# Patient Record
Sex: Female | Born: 1965 | Race: White | Hispanic: No | Marital: Married
Health system: Southern US, Community
[De-identification: ages and names within clinical notes are randomized; demographics above are authoritative.]

---

## 1997-11-11 ENCOUNTER — Inpatient Hospital Stay (HOSPITAL_COMMUNITY): Admission: AD | Admit: 1997-11-11 | Discharge: 1997-11-14 | Payer: Self-pay | Admitting: *Deleted

## 1997-11-18 ENCOUNTER — Encounter: Admission: RE | Admit: 1997-11-18 | Discharge: 1998-02-16 | Payer: Self-pay | Admitting: *Deleted

## 1998-02-23 ENCOUNTER — Encounter (HOSPITAL_COMMUNITY): Admission: RE | Admit: 1998-02-23 | Discharge: 1998-05-24 | Payer: Self-pay | Admitting: *Deleted

## 1999-01-20 ENCOUNTER — Other Ambulatory Visit: Admission: RE | Admit: 1999-01-20 | Discharge: 1999-01-20 | Payer: Self-pay | Admitting: *Deleted

## 2000-05-31 ENCOUNTER — Other Ambulatory Visit: Admission: RE | Admit: 2000-05-31 | Discharge: 2000-05-31 | Payer: Self-pay | Admitting: *Deleted

## 2000-11-13 ENCOUNTER — Encounter (INDEPENDENT_AMBULATORY_CARE_PROVIDER_SITE_OTHER): Payer: Self-pay

## 2000-11-13 ENCOUNTER — Ambulatory Visit (HOSPITAL_COMMUNITY): Admission: RE | Admit: 2000-11-13 | Discharge: 2000-11-13 | Payer: Self-pay | Admitting: *Deleted

## 2001-06-07 ENCOUNTER — Other Ambulatory Visit: Admission: RE | Admit: 2001-06-07 | Discharge: 2001-06-07 | Payer: Self-pay | Admitting: *Deleted

## 2002-06-18 ENCOUNTER — Other Ambulatory Visit: Admission: RE | Admit: 2002-06-18 | Discharge: 2002-06-18 | Payer: Self-pay | Admitting: *Deleted

## 2002-11-02 ENCOUNTER — Inpatient Hospital Stay (HOSPITAL_COMMUNITY): Admission: RE | Admit: 2002-11-02 | Discharge: 2002-11-02 | Payer: Self-pay | Admitting: Obstetrics and Gynecology

## 2003-07-29 ENCOUNTER — Inpatient Hospital Stay (HOSPITAL_COMMUNITY): Admission: AD | Admit: 2003-07-29 | Discharge: 2003-07-31 | Payer: Self-pay | Admitting: Obstetrics and Gynecology

## 2003-09-15 ENCOUNTER — Other Ambulatory Visit: Admission: RE | Admit: 2003-09-15 | Discharge: 2003-09-15 | Payer: Self-pay | Admitting: Obstetrics and Gynecology

## 2005-08-14 ENCOUNTER — Inpatient Hospital Stay (HOSPITAL_COMMUNITY): Admission: AD | Admit: 2005-08-14 | Discharge: 2005-08-16 | Payer: Self-pay | Admitting: Obstetrics and Gynecology

## 2006-12-05 ENCOUNTER — Encounter: Admission: RE | Admit: 2006-12-05 | Discharge: 2006-12-05 | Payer: Self-pay | Admitting: Obstetrics and Gynecology

## 2007-12-06 ENCOUNTER — Encounter: Admission: RE | Admit: 2007-12-06 | Discharge: 2007-12-06 | Payer: Self-pay | Admitting: Obstetrics and Gynecology

## 2008-03-12 ENCOUNTER — Encounter: Admission: RE | Admit: 2008-03-12 | Discharge: 2008-03-12 | Payer: Self-pay | Admitting: Family Medicine

## 2008-12-07 ENCOUNTER — Encounter: Admission: RE | Admit: 2008-12-07 | Discharge: 2008-12-07 | Payer: Self-pay | Admitting: Obstetrics and Gynecology

## 2009-12-08 ENCOUNTER — Encounter: Admission: RE | Admit: 2009-12-08 | Discharge: 2009-12-08 | Payer: Self-pay | Admitting: Obstetrics and Gynecology

## 2010-11-07 ENCOUNTER — Other Ambulatory Visit: Payer: Self-pay | Admitting: Obstetrics and Gynecology

## 2010-11-07 DIAGNOSIS — Z1231 Encounter for screening mammogram for malignant neoplasm of breast: Secondary | ICD-10-CM

## 2010-12-13 ENCOUNTER — Ambulatory Visit: Payer: Self-pay

## 2011-02-03 NOTE — H&P (Signed)
   NAME:  Vicki Gardner, Vicki Gardner                        ACCOUNT NO.:  [id number]   MEDICAL RECORD NO.:  [id number]                   PATIENT TYPE:  INP   LOCATION:  9303                                 FACILITY:  [facility]   PHYSICIAN:  Lenoard Aden, M.D.             DATE OF BIRTH:  [birth year]   DATE OF ADMISSION:  07/29/2003  DATE OF DISCHARGE:                                HISTORY & PHYSICAL   CHIEF COMPLAINT:  Postdates for induction.   HISTORY OF PRESENT ILLNESS:  The patient is a 45 year old white female G3,  P1, EDD July 25, 2003 at 40-4/7 weeks with favorable cervix for  induction.   ALLERGIES:  No known drug allergies.   MEDICATIONS:  Prenatal vitamins.   MEDICAL HISTORY:  1. History of vaginal delivery 8-pound 8-ounce female 66.  2. Missed AB with D&E in 2002.  3. History of Chlamydia.  4. History of HPV.   FAMILY HISTORY:  Family history of COPD, colon and breast cancer.   PERSONAL HISTORY:  Breast augmentation April 2003.   PRENATAL LABORATORY DATA:  Blood type A positive.  Rubella immune.  Hepatitis and HIV negative.   PRENATAL COURSE:  Remarkable for advanced maternal age and patient declined  amniocentesis; history of appropriate-for-gestational-age fetus.   PHYSICAL EXAMINATION:  GENERAL:  She is a well-developed, well-nourished  white female in no acute distress.  HEENT:  Normal.  LUNGS:  Clear.  HEART:  Regular rhythm.  ABDOMEN:  Soft, gravid, nontender.  CERVIX:  Two to three, 50%, vertex, -1.  EXTREMITIES:  No cords.  NEUROLOGIC:  Nonfocal.    IMPRESSION:  Postdates intrauterine pregnancy for induction.   PLAN:  Proceed with Pitocin, epidural as needed, anticipate vaginal  delivery.                                               Lenoard Aden, M.D.    RJT/MEDQ  D:  07/29/2003  T:  07/29/2003  Job:  [id number]

## 2011-02-03 NOTE — H&P (Signed)
NAMESHANORA, CHRISTENSEN              ACCOUNT NO.:  [id number]   MEDICAL RECORD NO.:  [id number]          PATIENT TYPE:  INP   LOCATION:  9121                          FACILITY:  [facility]   PHYSICIAN:  Lenoard Aden, M.D.DATE OF BIRTH:  [birth year]   DATE OF ADMISSION:  08/14/2005  DATE OF DISCHARGE:                                HISTORY & PHYSICAL   CHIEF COMPLAINT:  Post dates.   Patient is a 45 year old white female G4, P2, EDD of August 05, 2005 at [redacted]  weeks gestation who presents for induction for post dates status.  Her  pregnancy course has been uncomplicated.   PRENATAL LABORATORIES:  Blood type A+.  Rubella immune.  Hepatitis negative.  HIV declined.  GBS negative.   PHYSICAL EXAMINATION:  GENERAL:  Well-developed, well-nourished white female  in no acute distress.  HEENT:  Normal.  LUNGS:  Clear.  HEART:  Regular rate and rhythm.  ABDOMEN:  Soft, gravid, nontender.  Estimated fetal weight 7.5 pounds.  PELVIC:  Cervix is 3-4 cm, 90% vertex, -1 station.  EXTREMITIES:  No cords.  NEUROLOGIC:  Nonfocal.   IMPRESSION:  Post dates intrauterine pregnancy for induction.   PLAN:  Proceed with induction.  Risks, benefits discussed.  Epidural as  needed.      Lenoard Aden, M.D.  Electronically Signed     RJT/MEDQ  D:  08/14/2005  T:  08/14/2005  Job:  [phone]

## 2011-02-03 NOTE — H&P (Signed)
[facility]  Patient:    Vicki Gardner, Vicki Gardner                       MRN: [id number] Adm. Date:  11/13/00 Attending:  Marina Gravel, M.D.                         History and Physical  PREOPERATIVE DIAGNOSIS:       Seven-week missed abortion.  HISTORY OF PRESENT ILLNESS:   A 45 year old white female gravida 2 para 1 LMP September 23, 2000 presented to the office on day prior to surgery with a one-day history of cramping and spotting.  Physical exam was within normal limits. Ultrasound revealed intrauterine pregnancy with absence of fetal cardiac activity.  Crown-rump length was 1.7 cm.  PAST MEDICAL HISTORY:         None.  PAST SURGICAL HISTORY:        None.  PAST OBSTETRICAL HISTORY:     Spontaneous vaginal delivery x 1, uncomplicated.  MEDICATIONS:                  None.  ALLERGIES:                    None.  SOCIAL HISTORY:               No alcohol, tobacco, or other drugs.  REVIEW OF SYSTEMS:            CONSTITUTIONAL:  No unexplained weight change, fever, or dizzy spells.  EYES:  No trouble with eyes.  ENT:  No problems with ears or hearing or nosebleeds.  CARDIOVASCULAR:  No chest pain or irregular heart beat.  RESPIRATORY:  No cough or shortness of breath.  GASTROINTESTINAL: No nausea, vomiting, constipation, or blood in stools.  GENITOURINARY:  As per HPI.  MUSCULOSKELETAL:  No joint or muscle pain.  SKIN/BREAST:  No lesions. NEUROLOGIC:  No headaches or trouble with balance.  PSYCHIATRIC:  No work or family problems, domestic violence, or history of sexual assault.  ENDOCRINE: No hot flashes, thyroid disease, or diabetes.  HEMATOLOGIC:  No unexplained bruising or bleeding.  PHYSICAL EXAMINATION:  VITAL SIGNS:                  Blood pressure 104/60, weight 136, height 5 feet 7.5 inches.  GENERAL:                      The patient is alert and oriented, no acute distress.  SKIN:                         Warm and dry, no lesions.  HEART:                         Regular rate and rhythm.  LUNGS:                        Clear to auscultation.  ABDOMEN:                      Soft.  Liver and spleen normal, no hernia.  PELVIC:                       Normal external female genitalia, vagina and cervix normal.  Uterus anterior, seven to eight  weeks size, nontender.  No adnexal masses or tenderness.  LABORATORY DATA:              Ultrasound performed confirms single IUP, crown-rump length 1.7 cm, no fetal cardiac activity is seen and this is confirmed on Doppler mode.  ASSESSMENT:                   Seven-week missed abortion.  Patient desires D&E.  PLAN:                         Operative risks discussed, including infection, bleeding, perforation, damage to bowel, bladder, or surrounding organs.  All questions answered.  Patient wishes to proceed.  Of note, her blood type is A positive.  Arrangements to be made for D&E at [facility] on November 13, 2000. DD:  11/12/00 TD:  11/12/00 Job: [id number]

## 2011-02-03 NOTE — Op Note (Signed)
[facility]  Patient:    Vicki Gardner, Vicki Gardner                     MRN: [id number] Proc. Date: 11/13/00 Adm. Date:  [id number] Attending:  Marina Gravel B                           Operative Report  PREOPERATIVE DIAGNOSIS:       7-week missed AB.  POSTOPERATIVE DIAGNOSIS:      7-week missed AB.  OPERATION:                    D&E.  SURGEON:                      Marina Gravel, M.D.  ASSISTANT:  ANESTHESIA:                   MAC and 15 cc of 2% lidocaine paracervical block.  ESTIMATED BLOOD LOSS:         50 cc.  COMPLICATIONS:                None.  INDICATIONS:                  The patient presented with first trimester spotting.  7-week missed AB documented on ultrasound.  The patient elected for D&E versus expectant management.  DESCRIPTION OF PROCEDURE:     The patient was taken to the operating room and MAC anesthesia established.  She was prepped and draped in the standard fashion and the bladder emptied with a red rubber catheter.  She was examined under anesthesia and had a 7-week size anterior uterus, no adnexal masses.  The speculum was inserted into the vagina and the cervix was visualized.  15 cc of 2% lidocaine inserted as paracervical block.  The anterior lip of the cervix was grasped with a single tooth tenaculum. The cervix was easily dilated to #21.  #7 suction cannula was inserted and suction obtained.  Tissue returned.  Two passes until no tissue returned.  The uterus was then gently curetted and a gritty texture was noted throughout.  The suction cannula was passed again and no tissue returned.  The procedure was terminated.  All instruments were removed from the vagina.  The cervix was hemostatic.  The patient tolerated the procedure well and there were no complications.  She was taken to the recovery room awake, alert, and in stable condition.  Of note her blood type is A positive. DD:  11/13/00 TD:  11/14/00 Job:  [id number]

## 2011-08-18 ENCOUNTER — Ambulatory Visit
Admission: RE | Admit: 2011-08-18 | Discharge: 2011-08-18 | Disposition: A | Discharge status: Home / Self Care | Payer: Self-pay | Source: Ambulatory Visit | Attending: Obstetrics and Gynecology | Admitting: Obstetrics and Gynecology

## 2011-08-18 DIAGNOSIS — Z1231 Encounter for screening mammogram for malignant neoplasm of breast: Secondary | ICD-10-CM

## 2012-07-09 ENCOUNTER — Other Ambulatory Visit: Payer: Self-pay | Admitting: Obstetrics and Gynecology

## 2012-07-09 DIAGNOSIS — Z9882 Breast implant status: Secondary | ICD-10-CM

## 2012-07-09 DIAGNOSIS — Z1231 Encounter for screening mammogram for malignant neoplasm of breast: Secondary | ICD-10-CM

## 2012-08-19 ENCOUNTER — Ambulatory Visit
Admission: RE | Admit: 2012-08-19 | Discharge: 2012-08-19 | Disposition: A | Discharge status: Home / Self Care | Payer: BC Managed Care – PPO | Source: Ambulatory Visit | Attending: Obstetrics and Gynecology | Admitting: Obstetrics and Gynecology

## 2012-08-19 DIAGNOSIS — Z9882 Breast implant status: Secondary | ICD-10-CM

## 2012-08-19 DIAGNOSIS — Z1231 Encounter for screening mammogram for malignant neoplasm of breast: Secondary | ICD-10-CM

## 2013-07-17 ENCOUNTER — Other Ambulatory Visit: Payer: Self-pay

## 2013-07-17 DIAGNOSIS — Z1231 Encounter for screening mammogram for malignant neoplasm of breast: Secondary | ICD-10-CM

## 2013-07-17 DIAGNOSIS — Z9882 Breast implant status: Secondary | ICD-10-CM

## 2013-08-20 ENCOUNTER — Ambulatory Visit
Admission: RE | Admit: 2013-08-20 | Discharge: 2013-08-20 | Disposition: A | Discharge status: Home / Self Care | Payer: BC Managed Care – PPO | Source: Ambulatory Visit

## 2013-08-20 DIAGNOSIS — Z9882 Breast implant status: Secondary | ICD-10-CM

## 2013-08-20 DIAGNOSIS — Z1231 Encounter for screening mammogram for malignant neoplasm of breast: Secondary | ICD-10-CM

## 2014-07-07 ENCOUNTER — Ambulatory Visit
Admission: RE | Admit: 2014-07-07 | Discharge: 2014-07-07 | Disposition: A | Discharge status: Home / Self Care | Payer: BC Managed Care – PPO | Source: Ambulatory Visit | Attending: Family Medicine | Admitting: Family Medicine

## 2014-07-07 ENCOUNTER — Other Ambulatory Visit: Payer: Self-pay | Admitting: Family Medicine

## 2014-07-07 DIAGNOSIS — T148XXA Other injury of unspecified body region, initial encounter: Secondary | ICD-10-CM

## 2016-03-22 ENCOUNTER — Other Ambulatory Visit: Payer: Self-pay | Admitting: Family Medicine

## 2016-03-22 ENCOUNTER — Ambulatory Visit
Admission: RE | Admit: 2016-03-22 | Discharge: 2016-03-22 | Disposition: A | Discharge status: Home / Self Care | Payer: No Typology Code available for payment source | Source: Ambulatory Visit | Attending: Family Medicine | Admitting: Family Medicine

## 2016-03-22 DIAGNOSIS — M79671 Pain in right foot: Secondary | ICD-10-CM

## 2016-06-13 ENCOUNTER — Ambulatory Visit
Admission: RE | Admit: 2016-06-13 | Discharge: 2016-06-13 | Disposition: A | Discharge status: Home / Self Care | Payer: No Typology Code available for payment source | Source: Ambulatory Visit | Attending: Family Medicine | Admitting: Family Medicine

## 2016-06-13 ENCOUNTER — Other Ambulatory Visit: Payer: Self-pay | Admitting: Family Medicine

## 2016-06-13 DIAGNOSIS — R198 Other specified symptoms and signs involving the digestive system and abdomen: Secondary | ICD-10-CM

## 2016-06-13 MED ORDER — IOPAMIDOL (ISOVUE-300) INJECTION 61%
100.0000 mL | Freq: Once | INTRAVENOUS | Status: AC | PRN
  Administered 2016-06-13: 100 mL via INTRAVENOUS

## 2017-07-26 ENCOUNTER — Other Ambulatory Visit: Payer: Self-pay | Admitting: Obstetrics and Gynecology

## 2017-07-26 DIAGNOSIS — R5381 Other malaise: Secondary | ICD-10-CM

## 2017-08-01 ENCOUNTER — Other Ambulatory Visit: Payer: Self-pay | Admitting: Obstetrics and Gynecology

## 2017-08-01 DIAGNOSIS — Z780 Asymptomatic menopausal state: Secondary | ICD-10-CM

## 2017-08-24 ENCOUNTER — Ambulatory Visit
Admission: RE | Admit: 2017-08-24 | Discharge: 2017-08-24 | Disposition: A | Discharge status: Home / Self Care | Payer: No Typology Code available for payment source | Source: Ambulatory Visit | Attending: Obstetrics and Gynecology | Admitting: Obstetrics and Gynecology

## 2017-08-24 DIAGNOSIS — Z780 Asymptomatic menopausal state: Secondary | ICD-10-CM

## 2017-09-24 IMAGING — CT CT ABD-PELV W/ CM
3 of 5 series · 12 of 36 positions shown, 18 images · IV contrast (WATER & [ID] ISOVUE 300)
Comparison: None.

CLINICAL DATA: Left-sided abdominal mass for several months.

EXAM:
CT ABDOMEN AND PELVIS WITH CONTRAST
TECHNIQUE: Multidetector CT imaging of the abdomen and pelvis was performed
using the standard protocol following bolus administration of
intravenous contrast.
CONTRAST:  100mL 3PHNQ2-AOO IOPAMIDOL (3PHNQ2-AOO) INJECTION 61%

[Series 3: abd/pelvis with · axial · 0.67mm/px · z∈[-398,-68]mm · 8 of 86 slices shown, 13 images]
[im 10/86  soft-tissue]
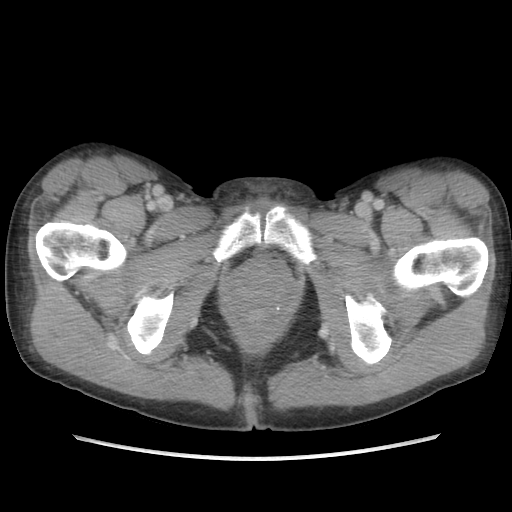
[im 10/86  bone]
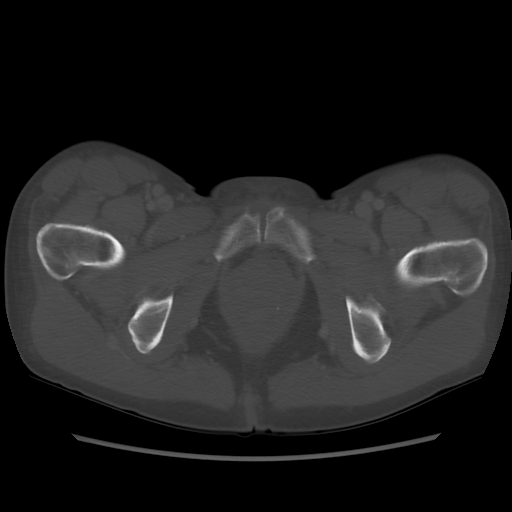
[im 19/86  soft-tissue]
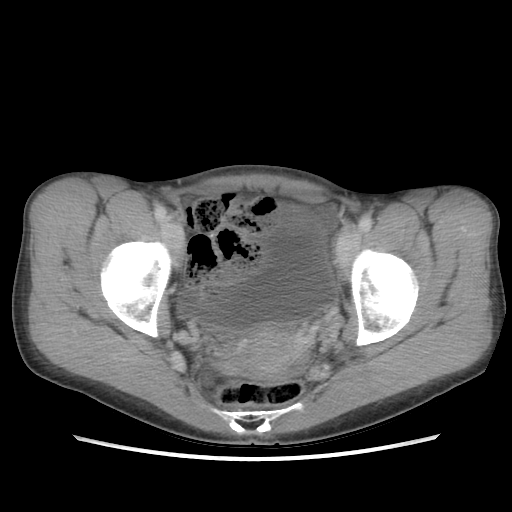
[im 29/86  soft-tissue]
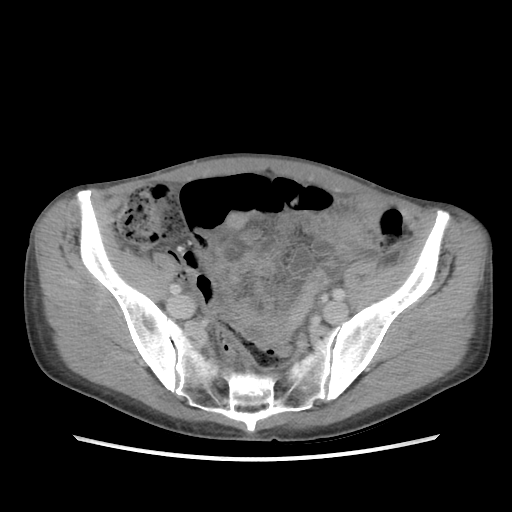
[im 38/86  soft-tissue]
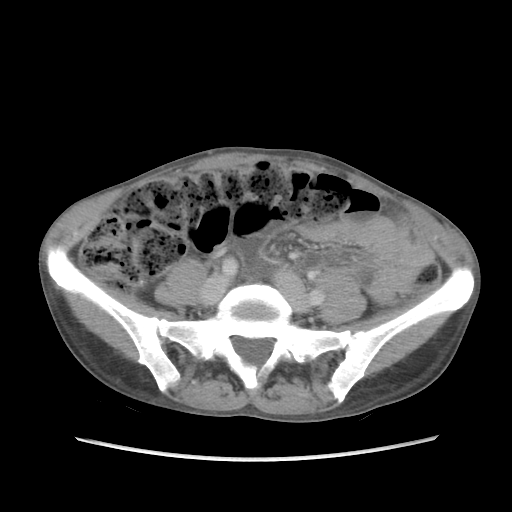
[im 48/86  soft-tissue]
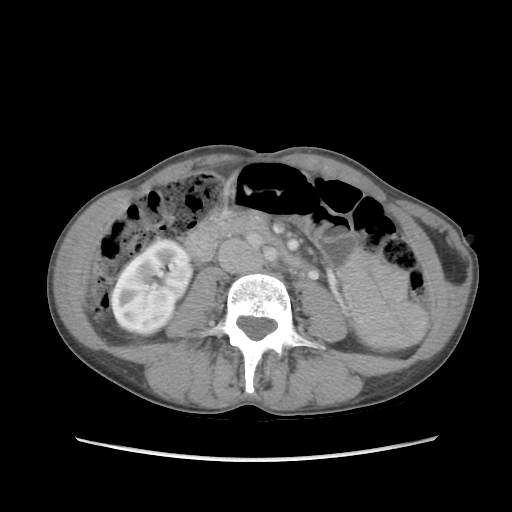
[im 48/86  lung]
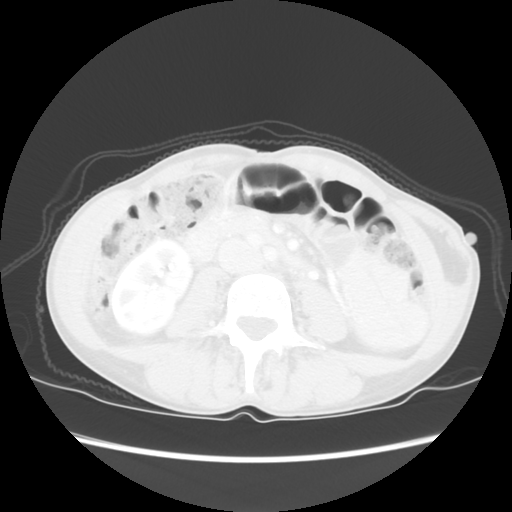
[im 57/86  soft-tissue]
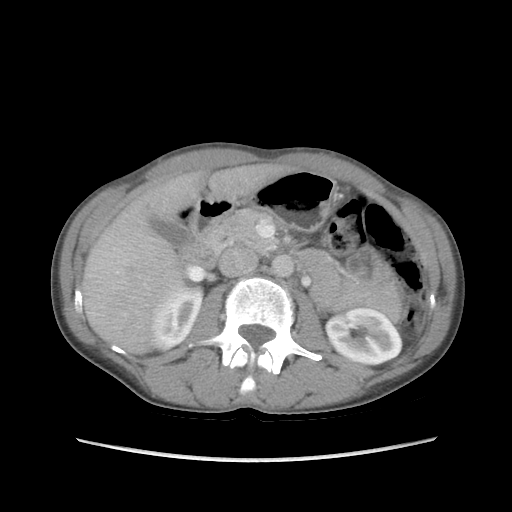
[im 57/86  lung]
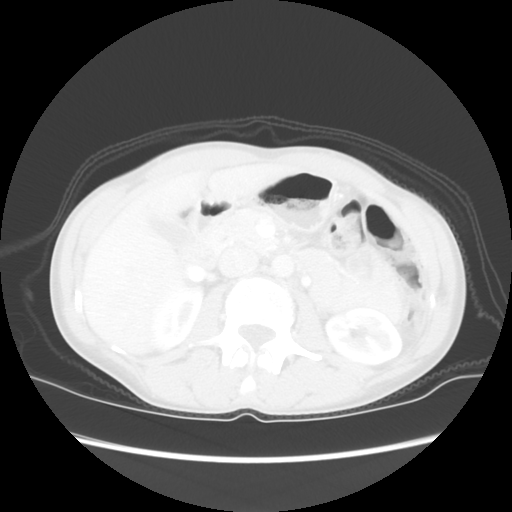
[im 67/86  soft-tissue]
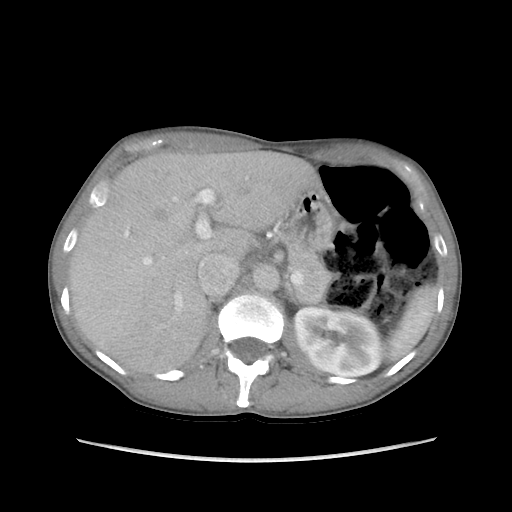
[im 67/86  lung]
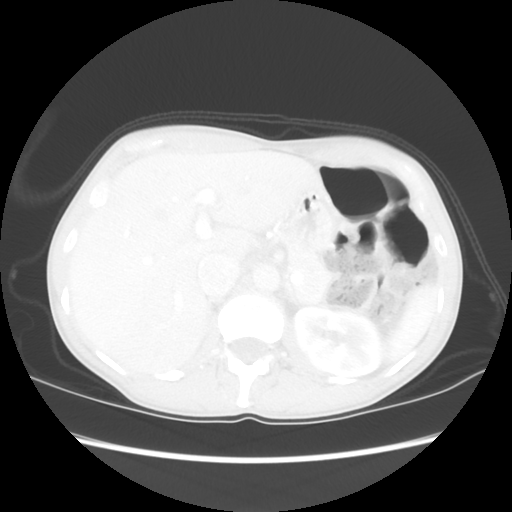
[im 76/86  soft-tissue]
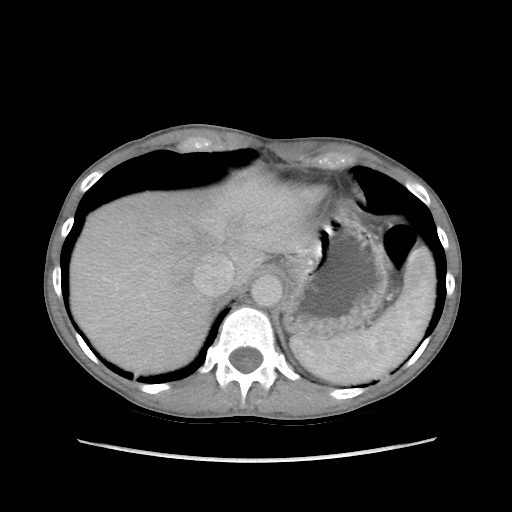
[im 76/86  lung]
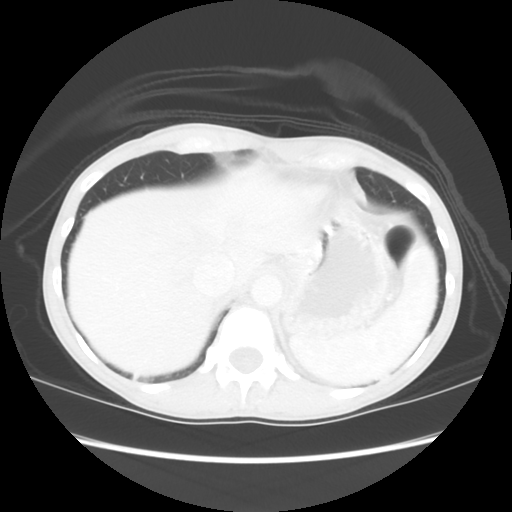

[Series 601: coronal body · coronal · 0.84mm/px · 1 of 99 slices shown, 2 images]
[im 33/99  soft-tissue]
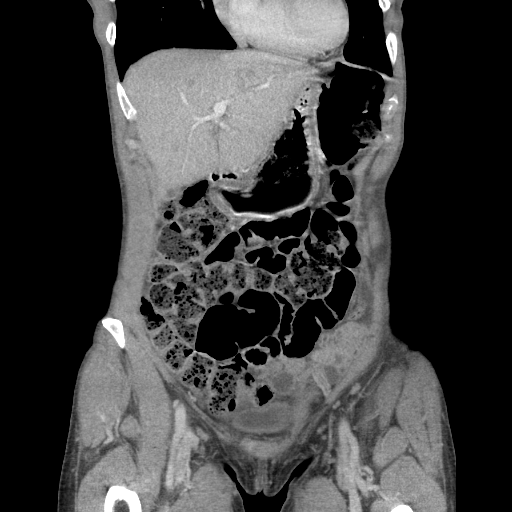
[im 33/99  bone]
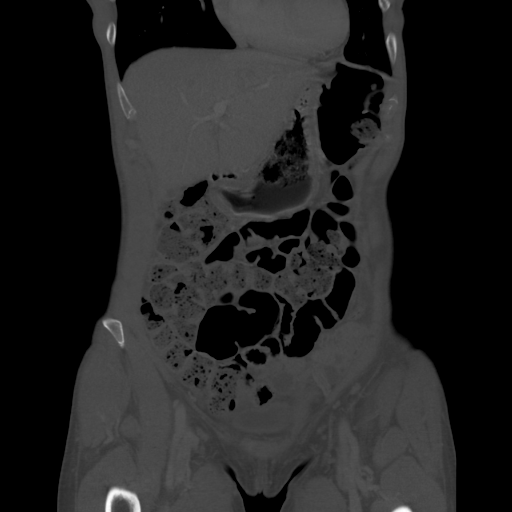

[Series 602: sagittal body · sagittal · 0.84mm/px · 3 of 138 slices shown]
[im 10/138  soft-tissue]
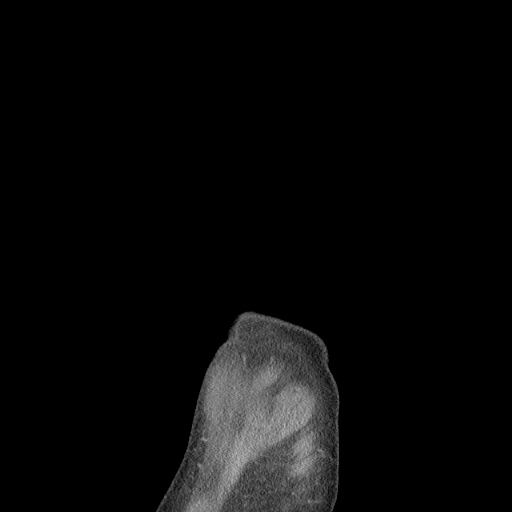
[im 30/138  soft-tissue]
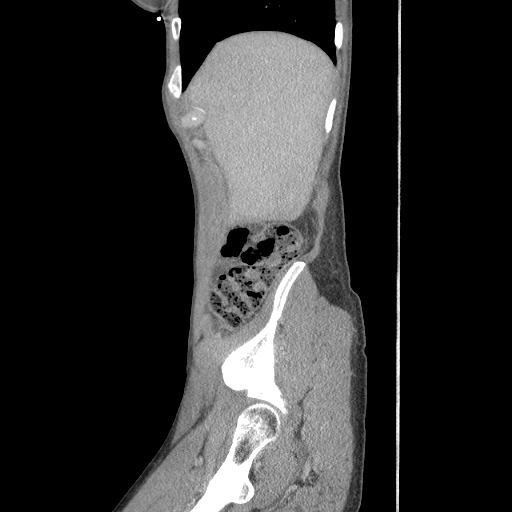
[im 49/138  soft-tissue]
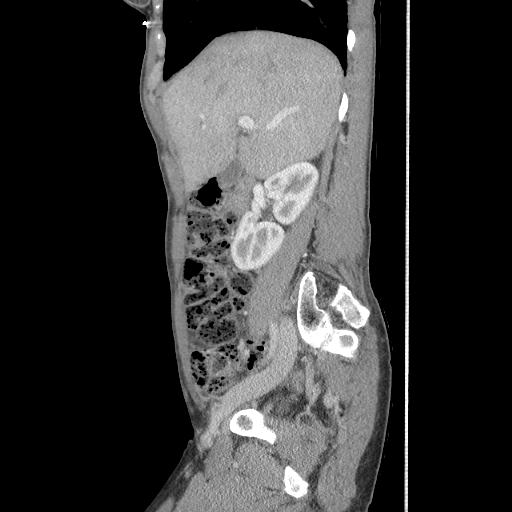

[12 of 36 positions shown; findings below may reference images not displayed]

FINDINGS: Lower chest: The lung bases are clear of acute process. No pleural
effusion or pulmonary lesions. The heart is normal in size. No
pericardial effusion. The distal esophagus and aorta are
unremarkable.

Hepatobiliary: No focal hepatic lesions or intrahepatic biliary
dilatation. The gallbladder is normal. No common bile duct
dilatation.

Pancreas: No mass, inflammation or ductal dilatation.

Spleen: Normal size.  No focal lesions.

Adrenals/Urinary Tract: The adrenal glands and kidneys are normal.
No ureteral or bladder calculi.

Stomach/Bowel: The stomach, duodenum, small bowel and colon are
grossly normal. No acute inflammatory changes, mass lesions or
obstructive findings. The terminal ileum is normal. The appendix is
normal. Moderate stool noted throughout the colon.

Vascular/Lymphatic: The aorta and branch vessels are normal. The
major venous structures are patent. No mesenteric or retroperitoneal
mass or adenopathy.

Reproductive: The uterus and ovaries are unremarkable. Slightly
prominent parametrial vessels bilaterally could suggest pelvic
congestion syndrome.

Other: The patient's palpable abnormality corresponds to a fatty
lesion located between the internal and external oblique muscles of
the left abdominal wall. This is not have the appearance of a simple
lipoma has there appear to be septations and some heterogeneity. It
measures approximately 10 x 5.5 x 3.1 cm. MR imaging without with
contrast may be helpful for further evaluation and better
characterization.

Musculoskeletal: No significant bony findings.
IMPRESSION: 10.0 x 5.5 x 3.1 cm fatty lesion located between the external and
internal oblique muscles of the left abdominal wall. This is most
likely a complex lipoma. Recommend MR imaging without and with
contrast for further evaluation.

No significant intra-abdominal/pelvic findings.

## 2019-09-11 ENCOUNTER — Other Ambulatory Visit: Payer: No Typology Code available for payment source

## 2020-02-23 ENCOUNTER — Other Ambulatory Visit: Payer: Self-pay | Admitting: Obstetrics and Gynecology

## 2020-02-23 DIAGNOSIS — M8580 Other specified disorders of bone density and structure, unspecified site: Secondary | ICD-10-CM

## 2020-05-28 ENCOUNTER — Other Ambulatory Visit: Payer: Self-pay

## 2020-05-28 ENCOUNTER — Ambulatory Visit
Admission: RE | Admit: 2020-05-28 | Discharge: 2020-05-28 | Disposition: A | Discharge status: Home / Self Care | Payer: BC Managed Care – PPO | Source: Ambulatory Visit | Attending: Obstetrics and Gynecology | Admitting: Obstetrics and Gynecology

## 2020-05-28 DIAGNOSIS — M8580 Other specified disorders of bone density and structure, unspecified site: Secondary | ICD-10-CM

## 2021-10-18 DIAGNOSIS — L84 Corns and callosities: Secondary | ICD-10-CM | POA: Diagnosis not present

## 2022-01-16 DIAGNOSIS — M25511 Pain in right shoulder: Secondary | ICD-10-CM | POA: Diagnosis not present

## 2022-01-24 DIAGNOSIS — E559 Vitamin D deficiency, unspecified: Secondary | ICD-10-CM | POA: Diagnosis not present

## 2022-01-24 DIAGNOSIS — E785 Hyperlipidemia, unspecified: Secondary | ICD-10-CM | POA: Diagnosis not present

## 2022-01-24 DIAGNOSIS — Z0000 Encounter for general adult medical examination without abnormal findings: Secondary | ICD-10-CM | POA: Diagnosis not present

## 2022-01-27 DIAGNOSIS — Z0000 Encounter for general adult medical examination without abnormal findings: Secondary | ICD-10-CM | POA: Diagnosis not present

## 2022-07-12 DIAGNOSIS — D225 Melanocytic nevi of trunk: Secondary | ICD-10-CM | POA: Diagnosis not present

## 2022-07-12 DIAGNOSIS — D2372 Other benign neoplasm of skin of left lower limb, including hip: Secondary | ICD-10-CM | POA: Diagnosis not present

## 2022-07-12 DIAGNOSIS — D2272 Melanocytic nevi of left lower limb, including hip: Secondary | ICD-10-CM | POA: Diagnosis not present

## 2022-07-12 DIAGNOSIS — D2239 Melanocytic nevi of other parts of face: Secondary | ICD-10-CM | POA: Diagnosis not present

## 2022-07-12 DIAGNOSIS — L570 Actinic keratosis: Secondary | ICD-10-CM | POA: Diagnosis not present

## 2022-08-15 DIAGNOSIS — Z681 Body mass index (BMI) 19 or less, adult: Secondary | ICD-10-CM | POA: Diagnosis not present

## 2022-08-15 DIAGNOSIS — Z01419 Encounter for gynecological examination (general) (routine) without abnormal findings: Secondary | ICD-10-CM | POA: Diagnosis not present

## 2022-08-15 DIAGNOSIS — Z1231 Encounter for screening mammogram for malignant neoplasm of breast: Secondary | ICD-10-CM | POA: Diagnosis not present

## 2022-08-15 DIAGNOSIS — Z124 Encounter for screening for malignant neoplasm of cervix: Secondary | ICD-10-CM | POA: Diagnosis not present

## 2022-08-17 ENCOUNTER — Other Ambulatory Visit: Payer: Self-pay | Admitting: Obstetrics and Gynecology

## 2022-08-17 DIAGNOSIS — E2839 Other primary ovarian failure: Secondary | ICD-10-CM

## 2022-12-07 DIAGNOSIS — M25511 Pain in right shoulder: Secondary | ICD-10-CM | POA: Diagnosis not present

## 2022-12-20 ENCOUNTER — Encounter (HOSPITAL_COMMUNITY): Payer: Self-pay | Admitting: Emergency Medicine

## 2022-12-20 ENCOUNTER — Emergency Department (HOSPITAL_COMMUNITY)
Admission: EM | Admit: 2022-12-20 | Discharge: 2022-12-21 | Disposition: A | Discharge status: Home / Self Care | Payer: BC Managed Care – PPO | Attending: Emergency Medicine | Admitting: Emergency Medicine

## 2022-12-20 DIAGNOSIS — W01190A Fall on same level from slipping, tripping and stumbling with subsequent striking against furniture, initial encounter: Secondary | ICD-10-CM | POA: Diagnosis not present

## 2022-12-20 DIAGNOSIS — R55 Syncope and collapse: Secondary | ICD-10-CM | POA: Diagnosis not present

## 2022-12-20 DIAGNOSIS — S0990XA Unspecified injury of head, initial encounter: Secondary | ICD-10-CM

## 2022-12-20 DIAGNOSIS — S8391XA Sprain of unspecified site of right knee, initial encounter: Secondary | ICD-10-CM | POA: Diagnosis not present

## 2022-12-20 DIAGNOSIS — S022XXA Fracture of nasal bones, initial encounter for closed fracture: Secondary | ICD-10-CM | POA: Diagnosis not present

## 2022-12-20 DIAGNOSIS — M25561 Pain in right knee: Secondary | ICD-10-CM | POA: Insufficient documentation

## 2022-12-20 DIAGNOSIS — S0031XA Abrasion of nose, initial encounter: Secondary | ICD-10-CM | POA: Insufficient documentation

## 2022-12-20 NOTE — ED Triage Notes (Signed)
Pt stood up and then passed out hitting nose/face on dresser. Small lac to bridge of nose. R knee pain. She reports this was a usual night as far as her eating and drinking and having a few glasses of wine.

## 2022-12-21 ENCOUNTER — Encounter (HOSPITAL_COMMUNITY): Payer: Self-pay | Admitting: Emergency Medicine

## 2022-12-21 ENCOUNTER — Emergency Department (HOSPITAL_COMMUNITY): Payer: BC Managed Care – PPO

## 2022-12-21 DIAGNOSIS — S022XXA Fracture of nasal bones, initial encounter for closed fracture: Secondary | ICD-10-CM | POA: Diagnosis not present

## 2022-12-21 LAB — URINALYSIS, ROUTINE W REFLEX MICROSCOPIC
Bacteria, UA: NONE SEEN
Bilirubin Urine: NEGATIVE
Glucose, UA: NEGATIVE mg/dL
Hgb urine dipstick: NEGATIVE
Ketones, ur: NEGATIVE mg/dL
Nitrite: NEGATIVE
Protein, ur: NEGATIVE mg/dL
Specific Gravity, Urine: 1.015 (ref 1.005–1.030)
pH: 5 (ref 5.0–8.0)

## 2022-12-21 LAB — COMPREHENSIVE METABOLIC PANEL
ALT: 19 U/L (ref 0–44)
AST: 26 U/L (ref 15–41)
Albumin: 4.4 g/dL (ref 3.5–5.0)
Alkaline Phosphatase: 43 U/L (ref 38–126)
Anion gap: 10 (ref 5–15)
BUN: 11 mg/dL (ref 6–20)
CO2: 26 mmol/L (ref 22–32)
Calcium: 9.8 mg/dL (ref 8.9–10.3)
Chloride: 101 mmol/L (ref 98–111)
Creatinine, Ser: 0.82 mg/dL (ref 0.44–1.00)
GFR, Estimated: >60 mL/min (ref >60)
Glucose, Bld: 127 mg/dL — ABNORMAL HIGH (ref 70–99)
Potassium: 3.7 mmol/L (ref 3.5–5.1)
Sodium: 137 mmol/L (ref 135–145)
Total Bilirubin: 0.6 mg/dL (ref 0.3–1.2)
Total Protein: 6.7 g/dL (ref 6.5–8.1)

## 2022-12-21 LAB — CBC
HCT: 38.8 % (ref 36.0–46.0)
Hemoglobin: 13 g/dL (ref 12.0–15.0)
MCH: 31.9 pg (ref 26.0–34.0)
MCHC: 33.5 g/dL (ref 30.0–36.0)
MCV: 95.3 fL (ref 80.0–100.0)
Platelets: 198 10*3/uL (ref 150–400)
RBC: 4.07 MIL/uL (ref 3.87–5.11)
RDW: 12.9 % (ref 11.5–15.5)
WBC: 4.4 10*3/uL (ref 4.0–10.5)
nRBC: 0 % (ref 0.0–0.2)

## 2022-12-21 LAB — CBG MONITORING, ED: Glucose-Capillary: 123 mg/dL — ABNORMAL HIGH (ref 70–99)

## 2022-12-21 LAB — I-STAT BETA HCG BLOOD, ED (MC, WL, AP ONLY): I-stat hCG, quantitative: <5 m[IU]/mL (ref <5)

## 2022-12-21 MED ORDER — OXYCODONE HCL 5 MG PO TABS: 5.0000 mg | ORAL_TABLET | ORAL | 0 refills | Status: AC | PRN

## 2022-12-21 MED ORDER — ACETAMINOPHEN 325 MG PO TABS: 650.0000 mg | ORAL_TABLET | Freq: Four times a day (QID) | ORAL | 0 refills | Status: AC | PRN

## 2022-12-21 NOTE — ED Provider Notes (Signed)
[facility] Provider Note  CSN: [id number] Arrival date & time: 12/20/22 2309  Chief Complaint(s) Loss of Consciousness  HPI Vicki Gardner is a 57 y.o. female with past medical history as below, significant for no significant medical history who presents to the ED with complaint of facial injury, syncope.  Patient reports she had a fall this evening.  She stood up, felt lightheaded, tunnel vision, felt as though she was going to have LOC, fell forward and hit her face on a dresser.  Brief LOC, no confusion afterwards, no witnessed seizure activity, no incontinence, nauseated following the incident but no vomiting.  No chest pain or dyspnea, no ongoing palpitations.  She has been ambulatory since the event, did have a nosebleed following the injury.  No vision or hearing changes.  No difficulty speaking or swallowing.  No numbness or tingling to extremities.  Patient does report she was drinking wine this pm  Past Medical History History reviewed. No pertinent past medical history. There are no problems to display for this patient.  Home Medication(s) Prior to Admission medications   Not on File                                                                                                                                    Past Surgical History History reviewed. No pertinent surgical history. Family History History reviewed. No pertinent family history.  Social History   Allergies Patient has no known allergies.  Review of Systems Review of Systems  Constitutional:  Negative for activity change and fever.  HENT:  Positive for facial swelling and nosebleeds. Negative for trouble swallowing.   Eyes:  Negative for discharge and redness.  Respiratory:  Negative for cough and shortness of breath.   Cardiovascular:  Negative for chest pain and palpitations.  Gastrointestinal:  Negative for abdominal pain and nausea.  Genitourinary:   Negative for dysuria and flank pain.  Musculoskeletal:  Negative for back pain and gait problem.  Skin:  Negative for pallor and rash.  Neurological:  Positive for syncope. Negative for headaches.    Physical Exam Vital Signs  I have reviewed the triage vital signs BP (!) 136/97   Pulse 63   Temp 98.1 F (36.7 C)   Resp 18   SpO2 98%  Physical Exam Vitals and nursing note reviewed.  Constitutional:      General: She is not in acute distress.    Appearance: Normal appearance.  HENT:     Head: Normocephalic. Raccoon eyes and abrasion present. No Battle's sign or contusion.     Jaw: There is normal jaw occlusion. No trismus.      Comments: Abrasion to nose, swelling noted to bridge of nose.  No drooling stridor or trismus    Right Ear: External ear normal.     Left Ear: External ear normal.     Nose: Nose normal.  Right Nostril: No septal hematoma.     Left Nostril: No septal hematoma.     Mouth/Throat:     Mouth: Mucous membranes are moist.  Eyes:     General: No scleral icterus.       Right eye: No discharge.        Left eye: No discharge.     Extraocular Movements: Extraocular movements intact.     Pupils: Pupils are equal, round, and reactive to light.  Cardiovascular:     Rate and Rhythm: Normal rate and regular rhythm.     Pulses: Normal pulses.     Heart sounds: Normal heart sounds.  Pulmonary:     Effort: Pulmonary effort is normal. No respiratory distress.     Breath sounds: Normal breath sounds.  Abdominal:     General: Abdomen is flat.     Tenderness: There is no abdominal tenderness.  Musculoskeletal:     Cervical back: No rigidity.     Right lower leg: No edema.     Left lower leg: No edema.       Legs:     Comments: Slight TTP to superior lateral aspect of right knee.  Ligaments intact, patellar and quad tendons intact.  Negative anterior drawer/posterior drawer.  No pain to ipsilateral hip or ankle.   Skin:    General: Skin is warm and dry.      Capillary Refill: Capillary refill takes less than 2 seconds.  Neurological:     Mental Status: She is alert and oriented to person, place, and time.     GCS: GCS eye subscore is 4. GCS verbal subscore is 5. GCS motor subscore is 6.     Cranial Nerves: Cranial nerves 2-12 are intact.     Sensory: Sensation is intact.     Motor: Motor function is intact.     Coordination: Coordination is intact.     Gait: Gait is intact.  Psychiatric:        Mood and Affect: Mood normal.        Behavior: Behavior normal.     ED Results and Treatments Labs (all labs ordered are listed, but only abnormal results are displayed) Labs Reviewed  COMPREHENSIVE METABOLIC PANEL - Abnormal; Notable for the following components:      Result Value   Glucose, Bld 127 (*)    All other components within normal limits  URINALYSIS, ROUTINE W REFLEX MICROSCOPIC - Abnormal; Notable for the following components:   Leukocytes,Ua SMALL (*)    All other components within normal limits  CBG MONITORING, ED - Abnormal; Notable for the following components:   Glucose-Capillary 123 (*)    All other components within normal limits  CBC  I-STAT BETA HCG BLOOD, ED ([facility], [facility], [facility] ONLY)                                                                                                                          Radiology CT Maxillofacial Wo  Contrast  Result Date: 12/21/2022 CLINICAL DATA:  Blunt facial trauma EXAM: CT MAXILLOFACIAL WITHOUT CONTRAST TECHNIQUE: Multidetector CT imaging of the maxillofacial structures was performed. Multiplanar CT image reconstructions were also generated. RADIATION DOSE REDUCTION: This exam was performed according to the departmental dose-optimization program which includes automated exposure control, adjustment of the mA and/or kV according to patient size and/or use of iterative reconstruction technique. COMPARISON:  None Available. FINDINGS: Osseous: There is a remote healed minimally angulated fracture of  the right nasal bone. No acute nasal fracture identified. No other facial fracture identified. No mandibular dislocation. Orbits: Negative. No traumatic or inflammatory finding. Sinuses: Small mucous retention cyst within the right maxillary sinus. The paranasal sinuses are otherwise clear. Soft tissues: There is moderate soft tissue swelling superficial to the nasal bones and nasion Limited intracranial: No significant or unexpected finding. IMPRESSION: 1. No acute facial fracture identified. 2. Remote healed minimally angulated fracture of the right nasal bone. 3. Moderate soft tissue swelling superficial to the nasal bones and nasion. Electronically Signed   By: Fidela Salisbury M.D.   On: 12/21/2022 01:00    Pertinent labs & imaging results that were available during my care of the patient were reviewed by me and considered in my medical decision making (see MDM for details).  Medications Ordered in ED Medications - No data to display                                                                                                                                   Procedures Procedures  (including critical care time)  Medical Decision Making / ED Course    Medical Decision Making:    JOSEY TREICHEL is a 57 y.o. female  with past medical history as below, significant for no significant medical history who presents to the ED with complaint of facial injury, syncope.. The complaint involves an extensive differential diagnosis and also carries with it a high risk of complications and morbidity.  Serious etiology was considered. Ddx includes but is not limited to: Cardiac syncope, simple syncope, vasovagal syncope, orthostatic hypotension, etc.  Complete initial physical exam performed, notably the patient  was no acute distress, resting comfortably, neuroexam is nonfocal.    Reviewed and confirmed nursing documentation for past medical history, family history, social history.  Vital signs  reviewed.   Patient presents with syncopal symptoms without worrisome features. Presentation most suggestive of neuro-cardiogenic or orthostatic cause. Very low suspicion for serious arrhythmia, cardiac ischemia or other serious etiology. ECG reviewed, no evidence of a cardiac arrhythmia such as Brugada, WPW, HOCM, IHSS, Long or short QT. Neurologic exam is nonfocal, not consistent with CVA or primary neurologic abnormality.   Discussed concussion precautions with patient and spouse at bedside.  Supportive care at home including RICE therapy for right knee likely sprain.  Patient appears safe for discharge with outpatient observation and close PCP F/U. Syncope warnings discussed with patient. The  patient has been instructed to return immediately if the symptoms worsen in any way. Patient verbalized understanding and is in agreement with current care plan. All questions answered prior to discharge.        Additional history obtained: -Additional history obtained from spouse -External records from outside source obtained and reviewed including: Chart review including previous notes, labs, imaging, consultation notes including primary care recommendation, medications, labs and imaging   Lab Tests: -I ordered, reviewed, and interpreted labs.   The pertinent results include:   Labs Reviewed  COMPREHENSIVE METABOLIC PANEL - Abnormal; Notable for the following components:      Result Value   Glucose, Bld 127 (*)    All other components within normal limits  URINALYSIS, ROUTINE W REFLEX MICROSCOPIC - Abnormal; Notable for the following components:   Leukocytes,Ua SMALL (*)    All other components within normal limits  CBG MONITORING, ED - Abnormal; Notable for the following components:   Glucose-Capillary 123 (*)    All other components within normal limits  CBC  I-STAT BETA HCG BLOOD, ED ([facility], [facility], [facility] ONLY)    Notable for stable  EKG   EKG Interpretation  Date/Time:  Wednesday December 20 2022 23:59:36 EDT Ventricular Rate:  65 PR Interval:  140 QRS Duration: 80 QT Interval:  376 QTC Calculation: 391 R Axis:   74 Text Interpretation: Normal sinus rhythm with sinus arrhythmia Possible Left atrial enlargement Nonspecific T wave abnormality Abnormal ECG No previous ECGs available Confirmed by Wynona Dove ([id number]) on 12/21/2022 4:31:47 AM         Imaging Studies ordered: I ordered imaging studies including the CT face I independently visualized the following imaging with scope of interpretation limited to determining acute life threatening conditions related to emergency care; findings noted above, significant for remote nasal bone fx, soft tissue swelling I independently visualized and interpreted imaging. I agree with the radiologist interpretation   Medicines ordered and prescription drug management: No orders of the defined types were placed in this encounter.   -I have reviewed the patients home medicines and have made adjustments as needed   Consultations Obtained: na   Cardiac Monitoring: The patient was maintained on a cardiac monitor.  I personally viewed and interpreted the cardiac monitored which showed an underlying rhythm of: SR  Social Determinants of Health:  Diagnosis or treatment significantly limited by social determinants of health: na   Reevaluation: After the interventions noted above, I reevaluated the patient and found that they have stayed the same  Co morbidities that complicate the patient evaluation History reviewed. No pertinent past medical history.    Dispostion: Disposition decision including need for hospitalization was considered, and patient discharged from emergency department.    Final Clinical Impression(s) / ED Diagnoses Final diagnoses:  Syncope, unspecified syncope type  Minor head injury, initial encounter     This chart was dictated using voice recognition software.  Despite best efforts to proofread,  errors  can occur which can change the documentation meaning.    Jeanell Sparrow, DO 12/21/22 [phone]

## 2022-12-21 NOTE — Discharge Instructions (Addendum)
Based on the events which brought you to the ER today, it is possible that you may have a concussion. A concussion occurs when there is a blow to the head or body, with enough force to shake the brain and disrupt how the brain functions. You may experience symptoms such as headaches, sensitivity to light/noise, dizziness, cognitive slowing, difficulty concentrating / remembering, trouble sleeping and drowsiness. These symptoms may last anywhere from hours/days to potentially weeks/months. While these symptoms are very frustrating and perhaps debilitating, it is important that you remember that they will improve over time. Everyone has a different rate of recovery; it is difficult to predict when your symptoms will resolve. In order to allow for your brain to heal after the injury, we recommend that you see your primary physician or a physician knowledgeable in concussion management. We also advise you to let your body and brain rest: avoid physical activities (sports, gym, and exercise) and reduce cognitive demands (reading, texting, TV watching, computer use, video games, etc). School attendance, after-school activities and work may need to be modified to avoid increasing symptoms. We recommend against driving until until all symptoms have resolved. You should take 650mg  of Acetaminophen (Tylenol) every 4 hours as needed for pain control. Come back to the ER right away if you are having repeated episodes of vomiting, severe/worsening headache/dizziness or any other symptom that alarms you. We recommended that someone stay with you for the next 24 hours to monitor for these worrisome symptoms.   It was a pleasure caring for you today in the emergency department.  Please return to the emergency department for any worsening or worrisome symptoms.

## 2023-01-15 DIAGNOSIS — C44519 Basal cell carcinoma of skin of other part of trunk: Secondary | ICD-10-CM | POA: Diagnosis not present

## 2023-01-15 DIAGNOSIS — L570 Actinic keratosis: Secondary | ICD-10-CM | POA: Diagnosis not present

## 2023-01-15 DIAGNOSIS — L820 Inflamed seborrheic keratosis: Secondary | ICD-10-CM | POA: Diagnosis not present

## 2023-01-15 DIAGNOSIS — C4441 Basal cell carcinoma of skin of scalp and neck: Secondary | ICD-10-CM | POA: Diagnosis not present

## 2023-01-26 ENCOUNTER — Ambulatory Visit
Admission: RE | Admit: 2023-01-26 | Discharge: 2023-01-26 | Disposition: A | Discharge status: Home / Self Care | Payer: BC Managed Care – PPO | Source: Ambulatory Visit | Attending: Obstetrics and Gynecology | Admitting: Obstetrics and Gynecology

## 2023-01-26 DIAGNOSIS — M810 Age-related osteoporosis without current pathological fracture: Secondary | ICD-10-CM | POA: Diagnosis not present

## 2023-01-26 DIAGNOSIS — E2839 Other primary ovarian failure: Secondary | ICD-10-CM

## 2023-01-26 DIAGNOSIS — N951 Menopausal and female climacteric states: Secondary | ICD-10-CM | POA: Diagnosis not present

## 2023-02-05 DIAGNOSIS — E559 Vitamin D deficiency, unspecified: Secondary | ICD-10-CM | POA: Diagnosis not present

## 2023-02-05 DIAGNOSIS — E785 Hyperlipidemia, unspecified: Secondary | ICD-10-CM | POA: Diagnosis not present

## 2023-02-08 DIAGNOSIS — Z0000 Encounter for general adult medical examination without abnormal findings: Secondary | ICD-10-CM | POA: Diagnosis not present

## 2023-02-09 ENCOUNTER — Other Ambulatory Visit: Payer: Self-pay | Admitting: Family Medicine

## 2023-02-09 DIAGNOSIS — M25511 Pain in right shoulder: Secondary | ICD-10-CM | POA: Diagnosis not present

## 2023-02-09 DIAGNOSIS — R591 Generalized enlarged lymph nodes: Secondary | ICD-10-CM

## 2023-02-13 DIAGNOSIS — M25511 Pain in right shoulder: Secondary | ICD-10-CM | POA: Diagnosis not present

## 2023-02-16 DIAGNOSIS — N951 Menopausal and female climacteric states: Secondary | ICD-10-CM | POA: Diagnosis not present

## 2023-02-16 DIAGNOSIS — R634 Abnormal weight loss: Secondary | ICD-10-CM | POA: Diagnosis not present

## 2023-02-16 DIAGNOSIS — E559 Vitamin D deficiency, unspecified: Secondary | ICD-10-CM | POA: Diagnosis not present

## 2023-02-16 DIAGNOSIS — M810 Age-related osteoporosis without current pathological fracture: Secondary | ICD-10-CM | POA: Diagnosis not present

## 2023-02-19 ENCOUNTER — Ambulatory Visit
Admission: RE | Admit: 2023-02-19 | Discharge: 2023-02-19 | Disposition: A | Discharge status: Home / Self Care | Payer: BC Managed Care – PPO | Source: Ambulatory Visit | Attending: Family Medicine | Admitting: Family Medicine

## 2023-02-19 DIAGNOSIS — R591 Generalized enlarged lymph nodes: Secondary | ICD-10-CM

## 2023-02-19 DIAGNOSIS — R590 Localized enlarged lymph nodes: Secondary | ICD-10-CM | POA: Diagnosis not present

## 2023-03-01 DIAGNOSIS — M7541 Impingement syndrome of right shoulder: Secondary | ICD-10-CM | POA: Diagnosis not present

## 2023-03-09 DIAGNOSIS — M25511 Pain in right shoulder: Secondary | ICD-10-CM | POA: Diagnosis not present

## 2023-03-12 DIAGNOSIS — M25511 Pain in right shoulder: Secondary | ICD-10-CM | POA: Diagnosis not present

## 2023-03-15 DIAGNOSIS — M25511 Pain in right shoulder: Secondary | ICD-10-CM | POA: Diagnosis not present

## 2023-03-20 DIAGNOSIS — M7541 Impingement syndrome of right shoulder: Secondary | ICD-10-CM | POA: Diagnosis not present

## 2023-03-26 DIAGNOSIS — M9903 Segmental and somatic dysfunction of lumbar region: Secondary | ICD-10-CM | POA: Diagnosis not present

## 2023-03-26 DIAGNOSIS — M25511 Pain in right shoulder: Secondary | ICD-10-CM | POA: Diagnosis not present

## 2023-03-26 DIAGNOSIS — M5431 Sciatica, right side: Secondary | ICD-10-CM | POA: Diagnosis not present

## 2023-03-28 DIAGNOSIS — D224 Melanocytic nevi of scalp and neck: Secondary | ICD-10-CM | POA: Diagnosis not present

## 2023-03-28 DIAGNOSIS — D2261 Melanocytic nevi of right upper limb, including shoulder: Secondary | ICD-10-CM | POA: Diagnosis not present

## 2023-03-28 DIAGNOSIS — Z85828 Personal history of other malignant neoplasm of skin: Secondary | ICD-10-CM | POA: Diagnosis not present

## 2023-03-28 DIAGNOSIS — L821 Other seborrheic keratosis: Secondary | ICD-10-CM | POA: Diagnosis not present

## 2023-04-02 DIAGNOSIS — M7541 Impingement syndrome of right shoulder: Secondary | ICD-10-CM | POA: Diagnosis not present

## 2023-04-06 DIAGNOSIS — M7541 Impingement syndrome of right shoulder: Secondary | ICD-10-CM | POA: Diagnosis not present

## 2023-05-01 DIAGNOSIS — M5431 Sciatica, right side: Secondary | ICD-10-CM | POA: Diagnosis not present

## 2023-05-01 DIAGNOSIS — M9903 Segmental and somatic dysfunction of lumbar region: Secondary | ICD-10-CM | POA: Diagnosis not present

## 2023-05-22 DIAGNOSIS — M9903 Segmental and somatic dysfunction of lumbar region: Secondary | ICD-10-CM | POA: Diagnosis not present

## 2023-05-22 DIAGNOSIS — M5431 Sciatica, right side: Secondary | ICD-10-CM | POA: Diagnosis not present

## 2023-06-26 DIAGNOSIS — M7741 Metatarsalgia, right foot: Secondary | ICD-10-CM | POA: Diagnosis not present

## 2023-06-27 DIAGNOSIS — G7249 Other inflammatory and immune myopathies, not elsewhere classified: Secondary | ICD-10-CM | POA: Diagnosis not present

## 2023-07-02 DIAGNOSIS — M5431 Sciatica, right side: Secondary | ICD-10-CM | POA: Diagnosis not present

## 2023-07-02 DIAGNOSIS — M9903 Segmental and somatic dysfunction of lumbar region: Secondary | ICD-10-CM | POA: Diagnosis not present

## 2023-07-23 DIAGNOSIS — M5431 Sciatica, right side: Secondary | ICD-10-CM | POA: Diagnosis not present

## 2023-07-23 DIAGNOSIS — M9903 Segmental and somatic dysfunction of lumbar region: Secondary | ICD-10-CM | POA: Diagnosis not present

## 2023-08-08 DIAGNOSIS — M2550 Pain in unspecified joint: Secondary | ICD-10-CM | POA: Diagnosis not present

## 2023-08-08 DIAGNOSIS — S0340XA Sprain of jaw, unspecified side, initial encounter: Secondary | ICD-10-CM | POA: Diagnosis not present

## 2023-08-10 DIAGNOSIS — S46312A Strain of muscle, fascia and tendon of triceps, left arm, initial encounter: Secondary | ICD-10-CM | POA: Diagnosis not present

## 2023-08-10 DIAGNOSIS — M7022 Olecranon bursitis, left elbow: Secondary | ICD-10-CM | POA: Diagnosis not present

## 2023-09-03 DIAGNOSIS — M9903 Segmental and somatic dysfunction of lumbar region: Secondary | ICD-10-CM | POA: Diagnosis not present

## 2023-09-03 DIAGNOSIS — M5431 Sciatica, right side: Secondary | ICD-10-CM | POA: Diagnosis not present

## 2023-09-04 DIAGNOSIS — E559 Vitamin D deficiency, unspecified: Secondary | ICD-10-CM | POA: Diagnosis not present

## 2023-09-04 DIAGNOSIS — M810 Age-related osteoporosis without current pathological fracture: Secondary | ICD-10-CM | POA: Diagnosis not present

## 2023-09-05 DIAGNOSIS — M5431 Sciatica, right side: Secondary | ICD-10-CM | POA: Diagnosis not present

## 2023-09-05 DIAGNOSIS — M9903 Segmental and somatic dysfunction of lumbar region: Secondary | ICD-10-CM | POA: Diagnosis not present

## 2023-09-10 DIAGNOSIS — M9903 Segmental and somatic dysfunction of lumbar region: Secondary | ICD-10-CM | POA: Diagnosis not present

## 2023-09-10 DIAGNOSIS — M5431 Sciatica, right side: Secondary | ICD-10-CM | POA: Diagnosis not present

## 2023-09-17 DIAGNOSIS — L821 Other seborrheic keratosis: Secondary | ICD-10-CM | POA: Diagnosis not present

## 2023-09-17 DIAGNOSIS — Z85828 Personal history of other malignant neoplasm of skin: Secondary | ICD-10-CM | POA: Diagnosis not present

## 2023-09-17 DIAGNOSIS — L738 Other specified follicular disorders: Secondary | ICD-10-CM | POA: Diagnosis not present

## 2023-09-17 DIAGNOSIS — L218 Other seborrheic dermatitis: Secondary | ICD-10-CM | POA: Diagnosis not present

## 2023-09-17 DIAGNOSIS — L570 Actinic keratosis: Secondary | ICD-10-CM | POA: Diagnosis not present

## 2024-02-12 ENCOUNTER — Other Ambulatory Visit: Payer: Self-pay | Admitting: Family Medicine

## 2024-02-12 DIAGNOSIS — E785 Hyperlipidemia, unspecified: Secondary | ICD-10-CM

## 2024-02-19 ENCOUNTER — Ambulatory Visit
Admission: RE | Admit: 2024-02-19 | Discharge: 2024-02-19 | Disposition: A | Discharge status: Home / Self Care | Source: Ambulatory Visit | Attending: Family Medicine | Admitting: Family Medicine

## 2024-02-19 DIAGNOSIS — E785 Hyperlipidemia, unspecified: Secondary | ICD-10-CM

## 2024-09-02 ENCOUNTER — Encounter (HOSPITAL_COMMUNITY): Payer: Self-pay | Admitting: Endocrinology

## 2024-09-02 ENCOUNTER — Other Ambulatory Visit (HOSPITAL_COMMUNITY): Payer: Self-pay | Admitting: Endocrinology

## 2024-09-02 DIAGNOSIS — M810 Age-related osteoporosis without current pathological fracture: Secondary | ICD-10-CM | POA: Insufficient documentation

## 2024-09-02 NOTE — Addendum Note (Signed)
 Addended by: DAYNE SHERRY RAMAN on: 09/02/2024 09:11 AM   Modules accepted: Orders

## 2024-09-08 ENCOUNTER — Telehealth (HOSPITAL_COMMUNITY): Payer: Self-pay | Admitting: Pharmacy Technician

## 2024-09-08 NOTE — Telephone Encounter (Signed)
"  See above.    "

## 2024-09-15 NOTE — Telephone Encounter (Signed)
 Auth Submission: APPROVED Site of care: Site of care: [facility] Payer: [employer] [place] Medication & CPT/J Code(s) submitted: Reclast (Zolendronic acid) [id number] Diagnosis Code: M81.0 Route of submission (phone, fax, portal): fax Phone # Fax # Auth type: Buy/Bill HB Units/visits requested: 5mg  x 1 dose Reference number: ASO [id number] Preferred [id number] Approval from: 09/02/24 to 09/01/25    Approval letter has been scanned into patient's media tab.

## 2024-09-24 ENCOUNTER — Ambulatory Visit (HOSPITAL_COMMUNITY)
Admission: RE | Admit: 2024-09-24 | Discharge: 2024-09-24 | Disposition: A | Discharge status: Home / Self Care | Source: Ambulatory Visit | Attending: Endocrinology | Admitting: Endocrinology

## 2024-09-24 VITALS — BP 106/74 | HR 53 | Temp 97.3°F | Resp 16

## 2024-09-24 DIAGNOSIS — M810 Age-related osteoporosis without current pathological fracture: Secondary | ICD-10-CM | POA: Insufficient documentation

## 2024-09-24 MED ORDER — ACETAMINOPHEN 325 MG PO TABS: 650.0000 mg | ORAL_TABLET | Freq: Once | ORAL | Status: DC

## 2024-09-24 MED ORDER — DIPHENHYDRAMINE HCL 25 MG PO CAPS: 25.0000 mg | ORAL_CAPSULE | Freq: Once | ORAL | Status: DC

## 2024-09-24 MED ORDER — ZOLEDRONIC ACID 5 MG/100ML IV SOLN
INTRAVENOUS | Status: AC
  Filled 2024-09-24: qty 100

## 2024-09-24 MED ORDER — ZOLEDRONIC ACID 5 MG/100ML IV SOLN
5.0000 mg | Freq: Once | INTRAVENOUS | Status: AC
  Administered 2024-09-24: 5 mg via INTRAVENOUS

## 2024-09-24 MED ORDER — SODIUM CHLORIDE 0.9 % IV SOLN: INTRAVENOUS | Status: DC
